# Patient Record
Sex: Female | Born: 1995 | Hispanic: No | Marital: Single | Smoking: Never smoker
Health system: Southern US, Community
[De-identification: ages and names within clinical notes are randomized; demographics above are authoritative.]

## PROBLEM LIST (undated history)

## (undated) HISTORY — PX: APPENDECTOMY: SHX54

---

## 2014-09-03 ENCOUNTER — Inpatient Hospital Stay (HOSPITAL_COMMUNITY)
Admission: AD | Admit: 2014-09-03 | Discharge: 2014-09-03 | Disposition: A | Discharge status: Home / Self Care | Payer: Self-pay | Source: Ambulatory Visit | Attending: Obstetrics & Gynecology | Admitting: Obstetrics & Gynecology

## 2014-09-03 ENCOUNTER — Inpatient Hospital Stay (HOSPITAL_COMMUNITY): Payer: Self-pay

## 2014-09-03 ENCOUNTER — Encounter (HOSPITAL_COMMUNITY): Payer: Self-pay | Admitting: *Deleted

## 2014-09-03 DIAGNOSIS — Z3202 Encounter for pregnancy test, result negative: Secondary | ICD-10-CM | POA: Insufficient documentation

## 2014-09-03 DIAGNOSIS — N949 Unspecified condition associated with female genital organs and menstrual cycle: Secondary | ICD-10-CM | POA: Insufficient documentation

## 2014-09-03 DIAGNOSIS — R102 Pelvic and perineal pain: Secondary | ICD-10-CM

## 2014-09-03 LAB — CBC
HCT: 37.6 % (ref 36.0–46.0)
Hemoglobin: 13 g/dL (ref 12.0–15.0)
MCH: 29.7 pg (ref 26.0–34.0)
MCHC: 34.6 g/dL (ref 30.0–36.0)
MCV: 85.8 fL (ref 78.0–100.0)
Platelets: 292 10*3/uL (ref 150–400)
RBC: 4.38 MIL/uL (ref 3.87–5.11)
RDW: 12.6 % (ref 11.5–15.5)
WBC: 8.1 10*3/uL (ref 4.0–10.5)

## 2014-09-03 LAB — URINALYSIS, ROUTINE W REFLEX MICROSCOPIC
Bilirubin Urine: NEGATIVE
Glucose, UA: NEGATIVE mg/dL
Hgb urine dipstick: NEGATIVE
KETONES UR: NEGATIVE mg/dL
Nitrite: NEGATIVE
PH: 7 (ref 5.0–8.0)
Protein, ur: NEGATIVE mg/dL
Specific Gravity, Urine: 1.01 (ref 1.005–1.030)
Urobilinogen, UA: 0.2 mg/dL (ref 0.0–1.0)

## 2014-09-03 LAB — POCT PREGNANCY, URINE: PREG TEST UR: NEGATIVE

## 2014-09-03 LAB — WET PREP, GENITAL
Clue Cells Wet Prep HPF POC: NONE SEEN
Trich, Wet Prep: NONE SEEN

## 2014-09-03 LAB — URINE MICROSCOPIC-ADD ON

## 2014-09-03 MED ORDER — IBUPROFEN 800 MG PO TABS
800.0000 mg | ORAL_TABLET | Freq: Once | ORAL | Status: AC
  Administered 2014-09-03: 800 mg via ORAL
  Filled 2014-09-03: qty 1

## 2014-09-03 MED ORDER — KETOROLAC TROMETHAMINE 60 MG/2ML IM SOLN
60.0000 mg | Freq: Once | INTRAMUSCULAR | Status: DC
  Filled 2014-09-03: qty 2

## 2014-09-03 NOTE — MAU Note (Signed)
Pt presents to MAU with complaints of pain in her lower abdomen. Reports that she had her appendix taken out a month ago and was told to follow up with a GYN physician due to infection in her abdomen

## 2014-09-03 NOTE — MAU Note (Signed)
Pt states had appendectomy a month ago in [place] and was told that she had infection in fallopian tubes and was told to f/u with OB/GYN. Is traveling from [place] and thought she'd be back home already. Feels bloated and has pain in lower abdomen. Points to suprapubic area.

## 2014-09-03 NOTE — MAU Provider Note (Signed)
History     CSN: [id number]  Arrival date and time: 09/03/14 1558   First Provider Initiated Contact with Patient 09/03/14 1631      Chief Complaint  Patient presents with  . Abdominal Pain   HPI  Kathryn Vega is a 18 y.o. G0P0000 who presents today with lower abdominal and pelvic pain. She states that last month she had an appendectomy. She was told that during the surgery they saw "a lot of infection in my tubes". She states that she was in the hospital for two weeks after her appendectomy, and was on antibiotics at that time. She was told to FU with her OBGYN. However, she is traveling and has not been home to see her OBGYN at this time. She states that the pain has increased and she has had a lot of bloating.   History reviewed. No pertinent past medical history.  Past Surgical History  Procedure Laterality Date  . Appendectomy      History reviewed. No pertinent family history.  History  Substance Use Topics  . Smoking status: Never Smoker   . Smokeless tobacco: Never Used  . Alcohol Use: No    Allergies: No Known Allergies  Prescriptions prior to admission  Medication Sig Dispense Refill Last Dose  . ibuprofen (ADVIL,MOTRIN) 200 MG tablet Take 400 mg by mouth every 6 (six) hours as needed for moderate pain.   09/03/2014 at Unknown time    ROS Physical Exam   Blood pressure 118/74, pulse 99, temperature 97.6 F (36.4 C), resp. rate 18, last menstrual period 08/20/2014.  Physical Exam  Nursing note and vitals reviewed. Constitutional: She is oriented to person, place, and time. She appears well-developed and well-nourished. No distress.  Cardiovascular: Normal rate.   Respiratory: Effort normal.  GI: Soft. There is no tenderness. There is no rebound.  Genitourinary:  External: no lesion Vagina: small amount of white discharge Cervix: pink, smooth,  No CMT Uterus: NSSC Adnexa: NT   Neurological: She is alert and oriented to person, place, and time.   Skin: Skin is warm and dry.  Psychiatric: She has a normal mood and affect.    MAU Course  Procedures  Results for orders placed or performed during the hospital encounter of 09/03/14 (from the past 24 hour(s))  Urinalysis, Routine w reflex microscopic     Status: Abnormal   Collection Time: 09/03/14  4:13 PM  Result Value Ref Range   Color, Urine YELLOW YELLOW   APPearance CLEAR CLEAR   Specific Gravity, Urine 1.010 1.005 - 1.030   pH 7.0 5.0 - 8.0   Glucose, UA NEGATIVE NEGATIVE mg/dL   Hgb urine dipstick NEGATIVE NEGATIVE   Bilirubin Urine NEGATIVE NEGATIVE   Ketones, ur NEGATIVE NEGATIVE mg/dL   Protein, ur NEGATIVE NEGATIVE mg/dL   Urobilinogen, UA 0.2 0.0 - 1.0 mg/dL   Nitrite NEGATIVE NEGATIVE   Leukocytes, UA MODERATE (A) NEGATIVE  Urine microscopic-add on     Status: Abnormal   Collection Time: 09/03/14  4:13 PM  Result Value Ref Range   Squamous Epithelial / LPF MANY (A) RARE   WBC, UA 11-20 <3 WBC/hpf   Bacteria, UA MANY (A) RARE   Urine-Other MUCOUS PRESENT   Pregnancy, urine POC     Status: None   Collection Time: 09/03/14  4:21 PM  Result Value Ref Range   Preg Test, Ur NEGATIVE NEGATIVE  CBC     Status: None   Collection Time: 09/03/14  4:39 PM  Result Value  Ref Range   WBC 8.1 4.0 - 10.5 K/uL   RBC 4.38 3.87 - 5.11 MIL/uL   Hemoglobin 13.0 12.0 - 15.0 g/dL   HCT 16.137.6 09.636.0 - 04.546.0 %   MCV 85.8 78.0 - 100.0 fL   MCH 29.7 26.0 - 34.0 pg   MCHC 34.6 30.0 - 36.0 g/dL   RDW 40.912.6 81.111.5 - 91.415.5 %   Platelets 292 150 - 400 K/uL  Wet prep, genital     Status: Abnormal   Collection Time: 09/03/14  4:50 PM  Result Value Ref Range   Yeast Wet Prep HPF POC FEW (A) NONE SEEN   Trich, Wet Prep NONE SEEN NONE SEEN   Clue Cells Wet Prep HPF POC NONE SEEN NONE SEEN   WBC, Wet Prep HPF POC MODERATE (A) NONE SEEN   [place] Transvaginal Non-ob  09/03/2014   CLINICAL DATA:  Pelvic pain.  EXAM: TRANSABDOMINAL AND TRANSVAGINAL ULTRASOUND OF PELVIS  TECHNIQUE: Both  transabdominal and transvaginal ultrasound examinations of the pelvis were performed. Transabdominal technique was performed for global imaging of the pelvis including uterus, ovaries, adnexal regions, and pelvic cul-de-sac. It was necessary to proceed with endovaginal exam following the transabdominal exam to visualize the adnexa.  COMPARISON:  None  FINDINGS: Uterus  Measurements: 6.3 x 3.3 x 4.2 cm. No fibroids or other mass visualized. Uterus is retroverted.  Endometrium  Thickness: 11 mm in thickness.  No focal abnormality visualized.  Right ovary  Measurements: 2.5 x 1.4 x 1.5 cm. Normal appearance/no adnexal mass.  Left ovary  Measurements: 3.4 x 2.0 x 2.0 cm. Normal appearance/no adnexal mass.  Other findings  Trace free fluid in the pelvis.  IMPRESSION: Unremarkable pelvic ultrasound.   Electronically Signed   By: Charlett NoseKevin  Dover M.D.   On: 09/03/2014 18:36   [place] Pelvis Complete  09/03/2014   CLINICAL DATA:  Pelvic pain.  EXAM: TRANSABDOMINAL AND TRANSVAGINAL ULTRASOUND OF PELVIS  TECHNIQUE: Both transabdominal and transvaginal ultrasound examinations of the pelvis were performed. Transabdominal technique was performed for global imaging of the pelvis including uterus, ovaries, adnexal regions, and pelvic cul-de-sac. It was necessary to proceed with endovaginal exam following the transabdominal exam to visualize the adnexa.  COMPARISON:  None  FINDINGS: Uterus  Measurements: 6.3 x 3.3 x 4.2 cm. No fibroids or other mass visualized. Uterus is retroverted.  Endometrium  Thickness: 11 mm in thickness.  No focal abnormality visualized.  Right ovary  Measurements: 2.5 x 1.4 x 1.5 cm. Normal appearance/no adnexal mass.  Left ovary  Measurements: 3.4 x 2.0 x 2.0 cm. Normal appearance/no adnexal mass.  Other findings  Trace free fluid in the pelvis.  IMPRESSION: Unremarkable pelvic ultrasound.   Electronically Signed   By: Charlett NoseKevin  Dover M.D.   On: 09/03/2014 18:36     Assessment and Plan   1. Pelvic pain in  female    No leukocytosis, pelvic US is normal.   Comfort measures Ibuprofen PRN  Follow-up Information    Follow up with [facility].   Specialty:  Emergency Medicine   Why:  If symptoms worsen   Contact information:   [address] [id number] [place] [id number] [phone]        Tawnya CrookHogan, Donyea Beverlin Donovan 09/03/2014, 4:52 PM

## 2014-09-03 NOTE — Discharge Instructions (Signed)
Abdominal Pain, Women °Abdominal (stomach, pelvic, or belly) pain can be caused by many things. It is important to tell your doctor: °· The location of the pain. °· Does it come and go or is it present all the time? °· Are there things that start the pain (eating certain foods, exercise)? °· Are there other symptoms associated with the pain (fever, nausea, vomiting, diarrhea)? °All of this is helpful to know when trying to find the cause of the pain. °CAUSES  °· Stomach: virus or bacteria infection, or ulcer. °· Intestine: appendicitis (inflamed appendix), regional ileitis (Crohn's disease), ulcerative colitis (inflamed colon), irritable bowel syndrome, diverticulitis (inflamed diverticulum of the colon), or cancer of the stomach or intestine. °· Gallbladder disease or stones in the gallbladder. °· Kidney disease, kidney stones, or infection. °· Pancreas infection or cancer. °· Fibromyalgia (pain disorder). °· Diseases of the female organs: °¨ Uterus: fibroid (non-cancerous) tumors or infection. °¨ Fallopian tubes: infection or tubal pregnancy. °¨ Ovary: cysts or tumors. °¨ Pelvic adhesions (scar tissue). °¨ Endometriosis (uterus lining tissue growing in the pelvis and on the pelvic organs). °¨ Pelvic congestion syndrome (female organs filling up with blood just before the menstrual period). °¨ Pain with the menstrual period. °¨ Pain with ovulation (producing an egg). °¨ Pain with an IUD (intrauterine device, birth control) in the uterus. °¨ Cancer of the female organs. °· Functional pain (pain not caused by a disease, may improve without treatment). °· Psychological pain. °· Depression. °DIAGNOSIS  °Your doctor will decide the seriousness of your pain by doing an examination. °· Blood tests. °· X-rays. °· Ultrasound. °· CT scan (computed tomography, special type of X-ray). °· MRI (magnetic resonance imaging). °· Cultures, for infection. °· Barium enema (dye inserted in the large intestine, to better view it with  X-rays). °· Colonoscopy (looking in intestine with a lighted tube). °· Laparoscopy (minor surgery, looking in abdomen with a lighted tube). °· Major abdominal exploratory surgery (looking in abdomen with a large incision). °TREATMENT  °The treatment will depend on the cause of the pain.  °· Many cases can be observed and treated at home. °· Over-the-counter medicines recommended by your caregiver. °· Prescription medicine. °· Antibiotics, for infection. °· Birth control pills, for painful periods or for ovulation pain. °· Hormone treatment, for endometriosis. °· Nerve blocking injections. °· Physical therapy. °· Antidepressants. °· Counseling with a psychologist or psychiatrist. °· Minor or major surgery. °HOME CARE INSTRUCTIONS  °· Do not take laxatives, unless directed by your caregiver. °· Take over-the-counter pain medicine only if ordered by your caregiver. Do not take aspirin because it can cause an upset stomach or bleeding. °· Try a clear liquid diet (broth or water) as ordered by your caregiver. Slowly move to a bland diet, as tolerated, if the pain is related to the stomach or intestine. °· Have a thermometer and take your temperature several times a day, and record it. °· Bed rest and sleep, if it helps the pain. °· Avoid sexual intercourse, if it causes pain. °· Avoid stressful situations. °· Keep your follow-up appointments and tests, as your caregiver orders. °· If the pain does not go away with medicine or surgery, you may try: °¨ Acupuncture. °¨ Relaxation exercises (yoga, meditation). °¨ Group therapy. °¨ Counseling. °SEEK MEDICAL CARE IF:  °· You notice certain foods cause stomach pain. °· Your home care treatment is not helping your pain. °· You need stronger pain medicine. °· You want your IUD removed. °· You feel faint or   lightheaded. °· You develop nausea and vomiting. °· You develop a rash. °· You are having side effects or an allergy to your medicine. °SEEK IMMEDIATE MEDICAL CARE IF:  °· Your  pain does not go away or gets worse. °· You have a fever. °· Your pain is felt only in portions of the abdomen. The right side could possibly be appendicitis. The left lower portion of the abdomen could be colitis or diverticulitis. °· You are passing blood in your stools (bright red or black tarry stools, with or without vomiting). °· You have blood in your urine. °· You develop chills, with or without a fever. °· You pass out. °MAKE SURE YOU:  °· Understand these instructions. °· Will watch your condition. °· Will get help right away if you are not doing well or get worse. °Document Released: 06/23/2007 Document Revised: 01/10/2014 Document Reviewed: 07/13/2009 °[employer]® Patient Information ©2015 [employer]. This information is not intended to replace advice given to you by your health care provider. Make sure you discuss any questions you have with your health care provider. ° °

## 2014-09-04 LAB — HIV ANTIBODY (ROUTINE TESTING W REFLEX): HIV 1&2 Ab, 4th Generation: NONREACTIVE

## 2014-09-05 LAB — URINE CULTURE

## 2014-09-05 LAB — GC/CHLAMYDIA PROBE AMP
CT Probe RNA: NEGATIVE
GC PROBE AMP APTIMA: NEGATIVE

## 2016-07-22 IMAGING — US US TRANSVAGINAL NON-OB
1 series · 14 of 25 positions shown · non-contrast
Comparison: None

CLINICAL DATA: Pelvic pain.



[Series 1: us pelvis complete · 14 of 35 slices shown]
[im 1/35]
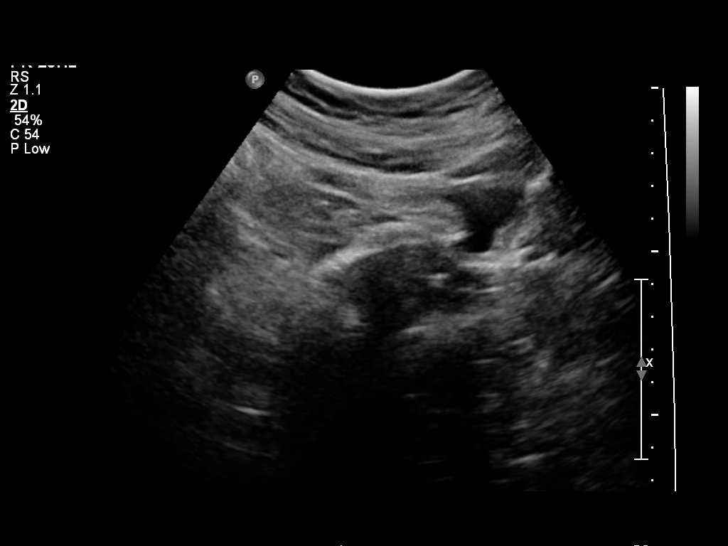
[im 3/35]
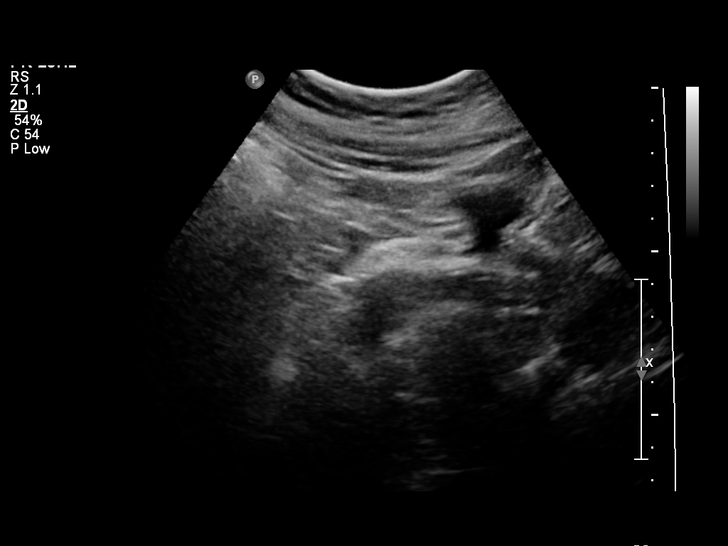
[im 6/35]
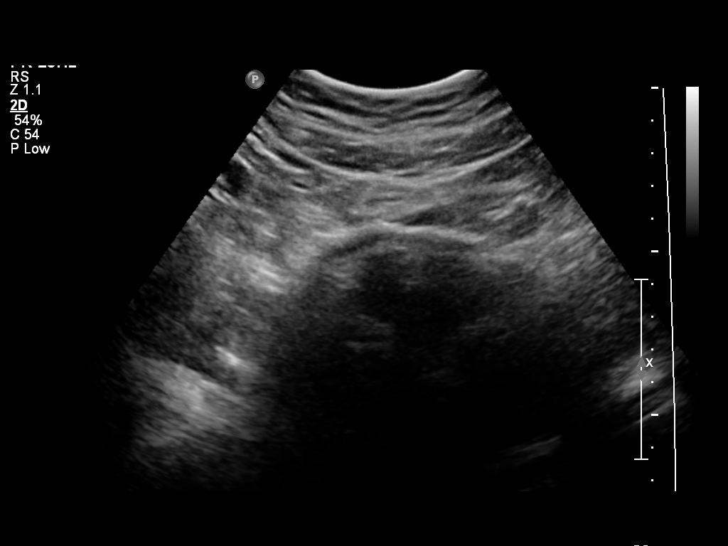
[im 9/35]
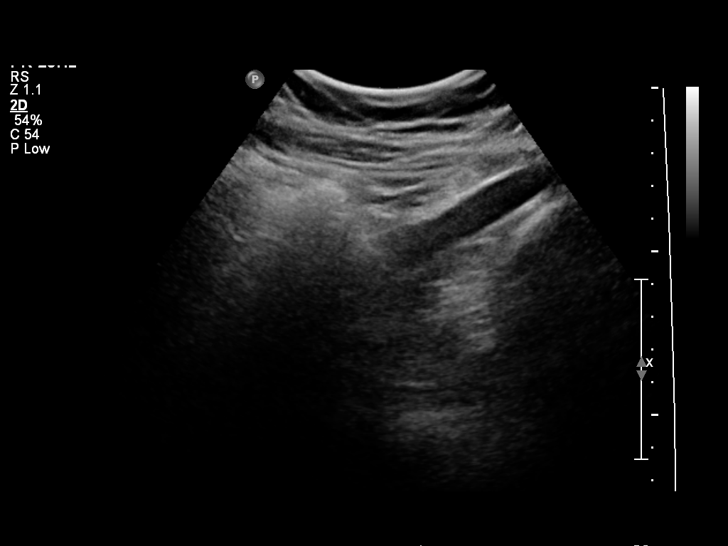
[im 12/35]
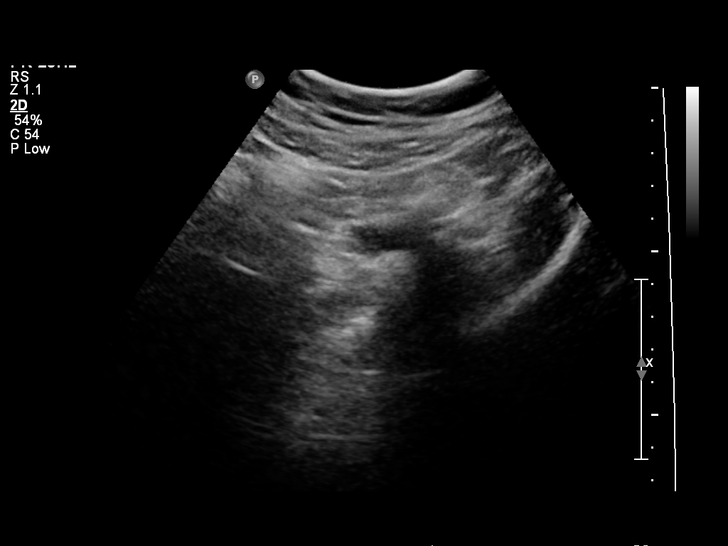
[im 13/35]
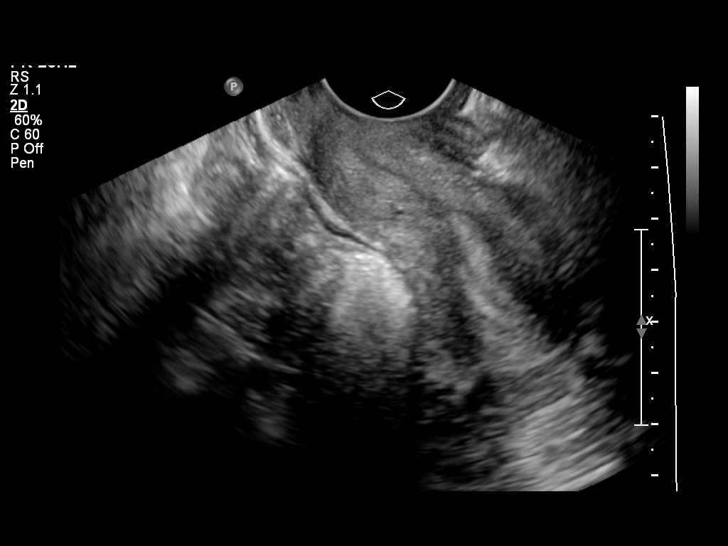
[im 16/35]
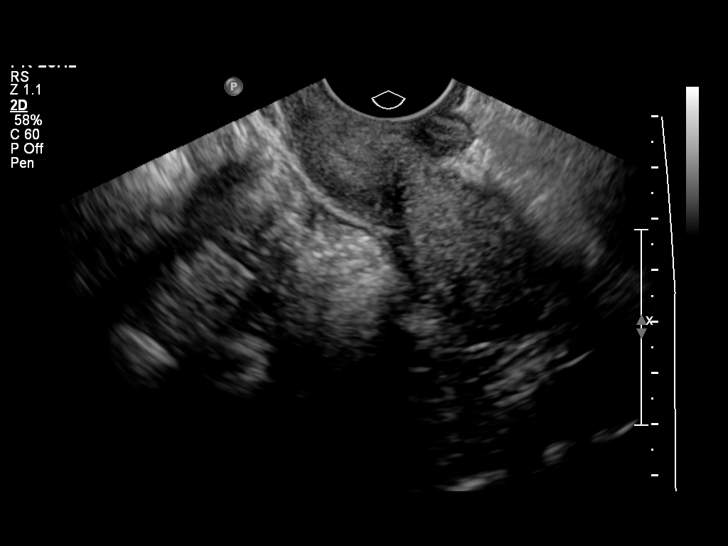
[im 19/35]
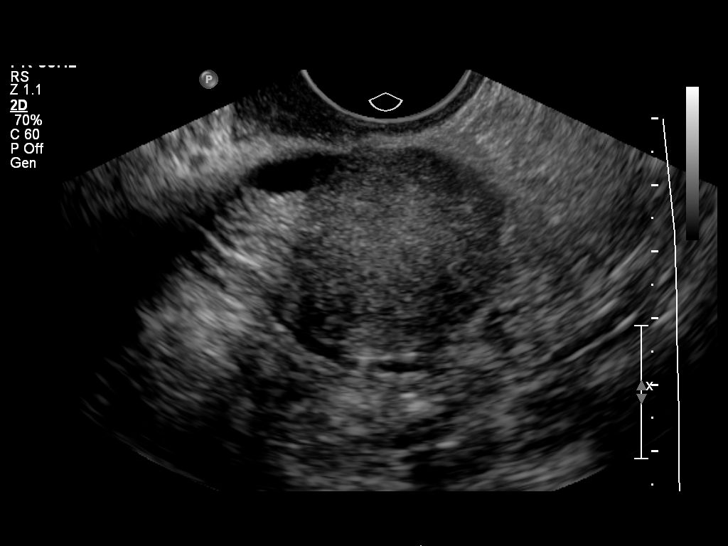
[im 22/35]
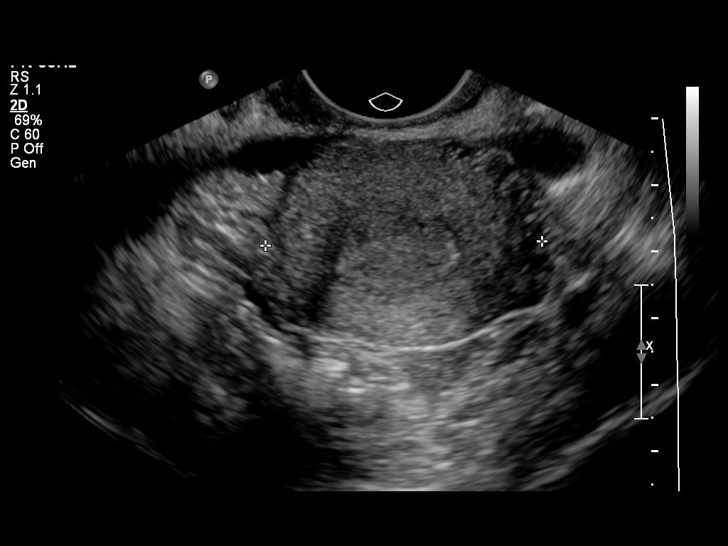
[im 23/35]
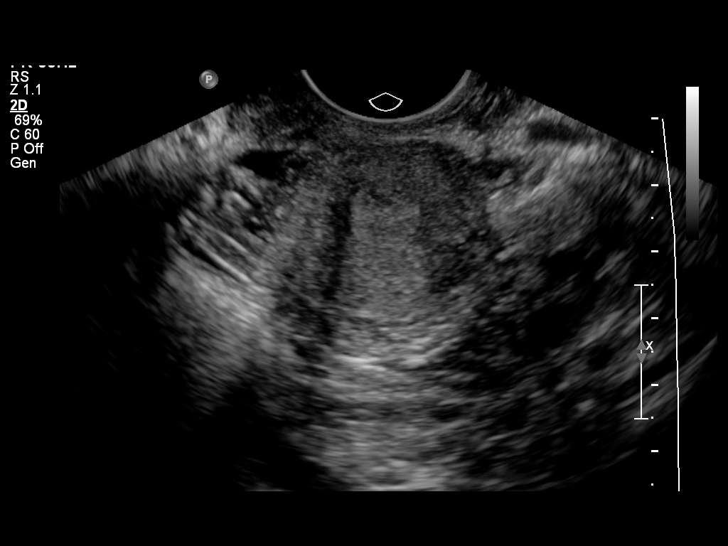
[im 26/35]
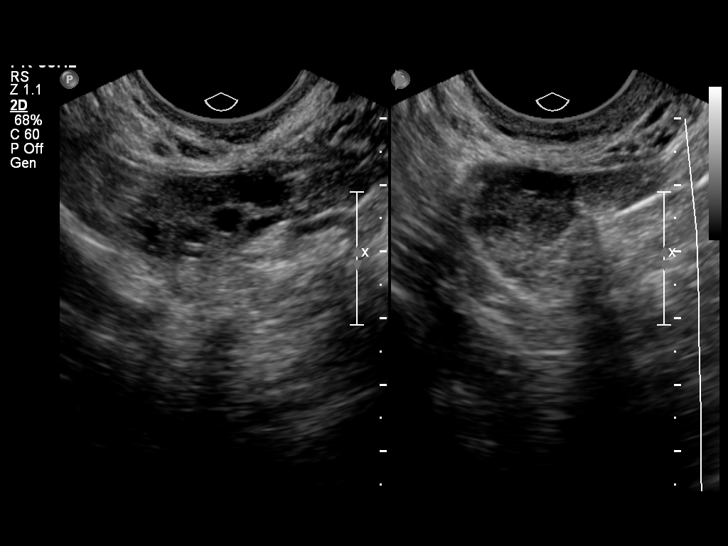
[im 29/35]
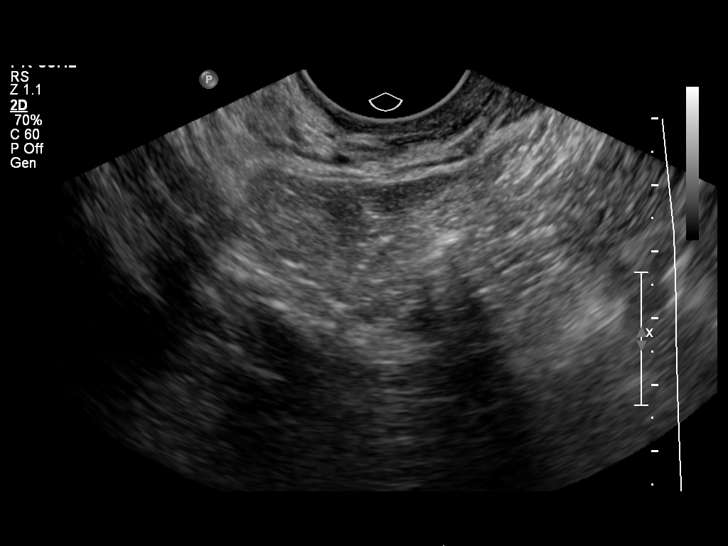
[im 32/35]
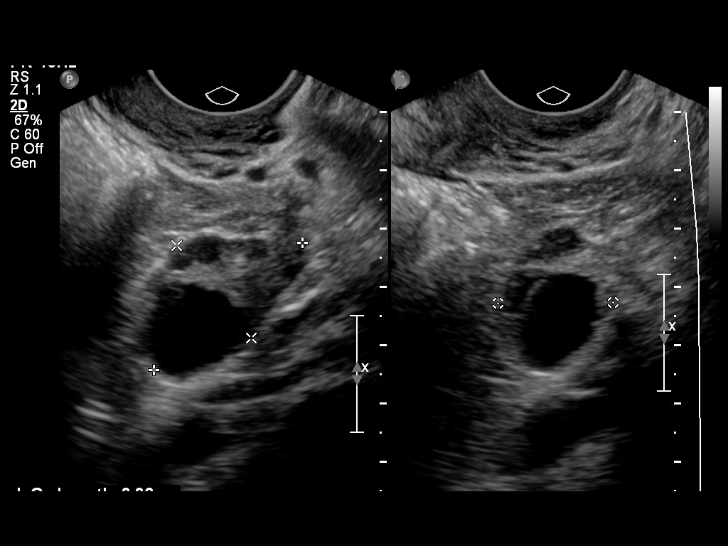
[im 35/35]
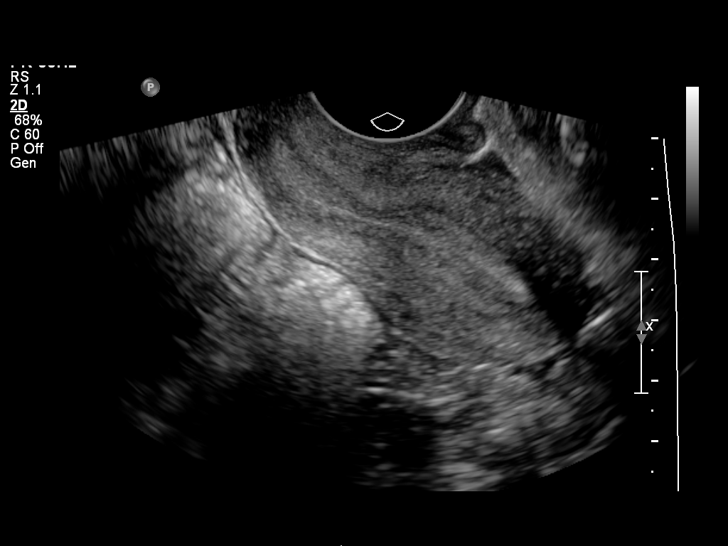

[14 of 25 positions shown; findings below may reference images not displayed]

FINDINGS: Uterus

Measurements: 6.3 x 3.3 x 4.2 cm. No fibroids or other mass
visualized. Uterus is retroverted.

Endometrium

Thickness: 11 mm in thickness.  No focal abnormality visualized.

Right ovary

Measurements: 2.5 x 1.4 x 1.5 cm. Normal appearance/no adnexal mass.

Left ovary

Measurements: 3.4 x 2.0 x 2.0 cm. Normal appearance/no adnexal mass.

Other findings

Trace free fluid in the pelvis.
IMPRESSION: Unremarkable pelvic ultrasound.
